# Patient Record
Sex: Female | Born: 1980 | Race: White | Hispanic: No | Marital: Married | Smoking: Current every day smoker
Health system: Southern US, Community
[De-identification: ages and names within clinical notes are randomized; demographics above are authoritative.]

## PROBLEM LIST (undated history)

## (undated) DIAGNOSIS — F32A Depression, unspecified: Secondary | ICD-10-CM

## (undated) DIAGNOSIS — F1120 Opioid dependence, uncomplicated: Secondary | ICD-10-CM

## (undated) DIAGNOSIS — F329 Major depressive disorder, single episode, unspecified: Secondary | ICD-10-CM

## (undated) HISTORY — DX: Major depressive disorder, single episode, unspecified: F32.9

## (undated) HISTORY — PX: BREAST ENHANCEMENT SURGERY: SHX7

## (undated) HISTORY — DX: Opioid dependence, uncomplicated: F11.20

## (undated) HISTORY — DX: Depression, unspecified: F32.A

## (undated) HISTORY — PX: AUGMENTATION MAMMAPLASTY: SUR837

---

## 2001-02-18 ENCOUNTER — Emergency Department (HOSPITAL_COMMUNITY): Admission: EM | Admit: 2001-02-18 | Discharge: 2001-02-18 | Payer: Self-pay | Admitting: Emergency Medicine

## 2001-03-10 ENCOUNTER — Other Ambulatory Visit: Admission: RE | Admit: 2001-03-10 | Discharge: 2001-03-10 | Payer: Self-pay | Admitting: Obstetrics and Gynecology

## 2001-04-22 ENCOUNTER — Other Ambulatory Visit: Admission: RE | Admit: 2001-04-22 | Discharge: 2001-04-22 | Payer: Self-pay | Admitting: Obstetrics & Gynecology

## 2001-05-13 ENCOUNTER — Ambulatory Visit (HOSPITAL_COMMUNITY): Admission: RE | Admit: 2001-05-13 | Discharge: 2001-05-13 | Payer: Self-pay | Admitting: Obstetrics & Gynecology

## 2001-05-13 ENCOUNTER — Encounter: Payer: Self-pay | Admitting: Obstetrics & Gynecology

## 2001-05-15 ENCOUNTER — Encounter: Payer: Self-pay | Admitting: Obstetrics & Gynecology

## 2001-05-15 ENCOUNTER — Inpatient Hospital Stay (HOSPITAL_COMMUNITY): Admission: EM | Admit: 2001-05-15 | Discharge: 2001-05-15 | Payer: Self-pay | Admitting: Emergency Medicine

## 2001-09-07 ENCOUNTER — Emergency Department (HOSPITAL_COMMUNITY): Admission: EM | Admit: 2001-09-07 | Discharge: 2001-09-07 | Payer: Self-pay | Admitting: *Deleted

## 2002-03-20 ENCOUNTER — Emergency Department (HOSPITAL_COMMUNITY): Admission: EM | Admit: 2002-03-20 | Discharge: 2002-03-20 | Payer: Self-pay | Admitting: Emergency Medicine

## 2002-05-20 ENCOUNTER — Emergency Department (HOSPITAL_COMMUNITY): Admission: EM | Admit: 2002-05-20 | Discharge: 2002-05-20 | Payer: Self-pay | Admitting: *Deleted

## 2002-09-02 ENCOUNTER — Ambulatory Visit (HOSPITAL_COMMUNITY): Admission: RE | Admit: 2002-09-02 | Discharge: 2002-09-02 | Payer: Self-pay | Admitting: Family Medicine

## 2002-09-02 ENCOUNTER — Encounter: Payer: Self-pay | Admitting: Family Medicine

## 2003-08-11 ENCOUNTER — Other Ambulatory Visit: Admission: RE | Admit: 2003-08-11 | Discharge: 2003-08-11 | Payer: Self-pay | Admitting: Obstetrics & Gynecology

## 2004-04-12 ENCOUNTER — Emergency Department (HOSPITAL_COMMUNITY): Admission: EM | Admit: 2004-04-12 | Discharge: 2004-04-12 | Payer: Self-pay | Admitting: Emergency Medicine

## 2004-04-26 ENCOUNTER — Ambulatory Visit (HOSPITAL_COMMUNITY): Admission: RE | Admit: 2004-04-26 | Discharge: 2004-04-26 | Payer: Self-pay | Admitting: Internal Medicine

## 2004-12-18 ENCOUNTER — Other Ambulatory Visit: Admission: RE | Admit: 2004-12-18 | Discharge: 2004-12-18 | Payer: Self-pay | Admitting: Obstetrics and Gynecology

## 2005-01-11 ENCOUNTER — Emergency Department (HOSPITAL_COMMUNITY): Admission: EM | Admit: 2005-01-11 | Discharge: 2005-01-11 | Payer: Self-pay | Admitting: Emergency Medicine

## 2005-04-14 ENCOUNTER — Emergency Department (HOSPITAL_COMMUNITY): Admission: EM | Admit: 2005-04-14 | Discharge: 2005-04-14 | Payer: Self-pay | Admitting: Emergency Medicine

## 2005-07-12 ENCOUNTER — Ambulatory Visit (HOSPITAL_COMMUNITY): Admission: RE | Admit: 2005-07-12 | Discharge: 2005-07-12 | Payer: Self-pay | Admitting: Obstetrics and Gynecology

## 2005-07-17 ENCOUNTER — Ambulatory Visit (HOSPITAL_COMMUNITY): Admission: AD | Admit: 2005-07-17 | Discharge: 2005-07-17 | Payer: Self-pay | Admitting: Obstetrics and Gynecology

## 2005-07-20 ENCOUNTER — Ambulatory Visit (HOSPITAL_COMMUNITY): Admission: AD | Admit: 2005-07-20 | Discharge: 2005-07-20 | Payer: Self-pay | Admitting: Obstetrics & Gynecology

## 2005-07-22 ENCOUNTER — Ambulatory Visit (HOSPITAL_COMMUNITY): Admission: AD | Admit: 2005-07-22 | Discharge: 2005-07-23 | Payer: Self-pay | Admitting: Obstetrics and Gynecology

## 2005-07-24 ENCOUNTER — Ambulatory Visit (HOSPITAL_COMMUNITY): Admission: AD | Admit: 2005-07-24 | Discharge: 2005-07-24 | Payer: Self-pay | Admitting: Obstetrics and Gynecology

## 2005-07-27 ENCOUNTER — Ambulatory Visit (HOSPITAL_COMMUNITY): Admission: AD | Admit: 2005-07-27 | Discharge: 2005-07-27 | Payer: Self-pay | Admitting: Obstetrics and Gynecology

## 2005-07-31 ENCOUNTER — Ambulatory Visit (HOSPITAL_COMMUNITY): Admission: AD | Admit: 2005-07-31 | Discharge: 2005-07-31 | Payer: Self-pay | Admitting: Obstetrics and Gynecology

## 2005-08-03 ENCOUNTER — Ambulatory Visit (HOSPITAL_COMMUNITY): Admission: AD | Admit: 2005-08-03 | Discharge: 2005-08-03 | Payer: Self-pay | Admitting: Obstetrics and Gynecology

## 2005-08-06 ENCOUNTER — Inpatient Hospital Stay (HOSPITAL_COMMUNITY): Admission: AD | Admit: 2005-08-06 | Discharge: 2005-08-09 | Payer: Self-pay | Admitting: Obstetrics and Gynecology

## 2009-11-04 ENCOUNTER — Emergency Department (HOSPITAL_COMMUNITY): Admission: EM | Admit: 2009-11-04 | Discharge: 2009-11-04 | Payer: Self-pay | Admitting: Emergency Medicine

## 2010-04-23 ENCOUNTER — Encounter: Payer: Self-pay | Admitting: Obstetrics and Gynecology

## 2010-06-16 LAB — CBC
MCH: 32.2 pg (ref 26.0–34.0)
MCHC: 34.8 g/dL (ref 30.0–36.0)
Platelets: 213 10*3/uL (ref 150–400)

## 2010-06-16 LAB — DIFFERENTIAL
Basophils Relative: 1 % (ref 0–1)
Eosinophils Absolute: 0.1 10*3/uL (ref 0.0–0.7)
Neutrophils Relative %: 63 % (ref 43–77)

## 2010-06-16 LAB — BASIC METABOLIC PANEL
CO2: 27 mEq/L (ref 19–32)
Calcium: 9.6 mg/dL (ref 8.4–10.5)
Creatinine, Ser: 0.72 mg/dL (ref 0.4–1.2)
GFR calc Af Amer: >60 mL/min (ref >60)
Glucose, Bld: 78 mg/dL (ref 70–99)

## 2010-08-18 NOTE — Discharge Summary (Signed)
Regina [name], Regina [name]             ACCOUNT NO.:  [id number]   MEDICAL RECORD NO.:  [id number]          PATIENT TYPE:  INP   LOCATION:  A412                          FACILITY:  [facility]   PHYSICIAN:  Tilda Burrow, M.D. DATE OF BIRTH:  17-Apr-1980   DATE OF ADMISSION:  08/06/2005  DATE OF DISCHARGE:  05/10/2007LH                                 DISCHARGE SUMMARY   ADMISSION DIAGNOSES:  1.  Pregnancy induced hypertension at 36.5 weeks.  2.  Prior cesarean section now for trial of labor.   DISCHARGE DIAGNOSES:  1.  Pregnancy induced hypertension at 36.5 weeks delivered.  2.  Prior cesarean section now for trial of labor.   PROCEDURE:  Repeat low transverse cervical cesarean section, Regina [name],  Aug 07, 2005.   DISCHARGE MEDICATIONS:  1.  Tylox 2 p.o. q.4 h p.r.n. pain.  2.  Prenatal vitamins 1 p.o. daily.   FOLLOW UP:  Six days staple removal.   HISTORY OF PRESENT ILLNESS:  This 30 year old female was admitted after  developing progressive symptoms, headaches, malaise, general sense of  feeling poorly with 4+ hyperreflexia, normal liver function tests but  development of headaches, upper abdominal pain and right sided back pain.  She is admitted with bedrest with blood pressure of 158/100, urinalysis  negative, reflexes 4+.  Estimated fetal weight 6.5 pounds.   Baseline labs confirmed stability of the mother with hemoglobin 10,  hematocrit 30, platelets 263,000. Liver function tests, AST of 26, ALT 20.  The patient's blood type is confirmed as A-.   HOSPITAL COURSE:  The patient was admitted and underwent bedrest overnight,  repeat cesarean section Aug 07, 2005 delivering a healthy 6 pound 0.5 ounce  female infant with Apgar's of 9 and 9. Postpartum course was uneventful. The  patient's a very energetic individual, quite active. Blood pressures were  140's/80's to 150/103 on the first postoperative day. They improved to 120's  to 140/74 to 87 on the second postoperative day.  The she was  discharged on Aug 09, 2005 at 5 p.m. in stable condition with good pain  control, healthy appearing incision with routine postsurgical instructions  reviewed including no lifting x2 weeks, no driving x2 weeks. Incision check  6 days in our office, etc.      Tilda Burrow, M.D.  Electronically Signed     JVF/MEDQ  D:  08/09/2005  T:  08/10/2005  Job:  [id number]

## 2010-08-18 NOTE — H&P (Signed)
[facility]  Patient:    Regina [name], Regina [name] Visit Number: [id number] MRN: [id number]          Service Type: Attending:  Duane Lope, M.D. Dictated by:   Duane Lope, M.D.                           History and Physical  DATE OF BIRTH:                1980/08/03  HISTORY OF PRESENT ILLNESS:   Patient is a 30 year old white female gravida 0 para 0 whose last menstrual period was April 15, 2001, who was seen in the office for evaluation of an abnormal Pap smear.  She had that performed on April 23, 2001 and she had two areas of acetowhite epithelium with no abnormal vessels and the entire squamocolumnar junction could be seen easily, and there were no endocervical lesions.  Two areas were biopsied and returned just having moderate dysplasia with involvement of endocervical glands with the moderate dysplasia, as well.  As a result, she would not be appropriate for cryotherapy in the office and is admitted for laser ablation of the cervix.  PAST MEDICAL HISTORY:         Negative.  PAST SURGICAL HISTORY:        Negative.  PAST OBSTETRICAL HISTORY:     Negative.  ALLERGIES:                    None.  MEDICATIONS:                  None.  REVIEW OF SYSTEMS:            Negative for all major organ systems.  SOCIAL HISTORY:               The patient is a Theatre stage manager currently and she gives no history of alcohol, drug abuse, or tobacco abuse.  PHYSICAL EXAMINATION:  VITAL SIGNS:                  Weight 105 pounds.  Blood pressure 110/70.  HEENT:                        Unremarkable.  Thyroid is normal.  LUNGS:                        Clear.  HEART:                        Regular rate and rhythm without murmur regurgitation or gallop.  BREASTS:                      Without mass, discharge, skin change.  No axillary adenopathy, no nipple changes.  ABDOMEN:                      Benign.  No hepatosplenomegaly, no masses, and is  nontender.  PELVIC:                       She has normal external genitalia.  Vagina clear without discharge.  Cervix without visible lesions.  Uterus normal size, shape, contour.  The ovaries are not tender.  NEUROLOGIC:  Grossly intact.  EXTREMITIES:                  Warm, no edema.  IMPRESSION:                   1. Cervical dysplasia, grade 2 or moderate.                               2. Endocervical glandular involvement.  PLAN:                         The patient is admitted for an outpatient laser ablation of the cervix secondary to dysplasia.  She understands the risks, benefits, indications, alternatives, and will proceed. Dictated by:   Duane Lope, M.D. Attending:  Duane Lope, M.D. DD:  05/09/01 TD:  05/09/01 Job: [id number]

## 2010-08-18 NOTE — H&P (Signed)
Regina [name], Regina [name]             ACCOUNT NO.:  [id number]   MEDICAL RECORD NO.:  [id number]           PATIENT TYPE:  INP   LOCATION:  A415                          FACILITY:  [facility]   PHYSICIAN:  Tilda Burrow, M.D. DATE OF BIRTH:  [birth year]   DATE OF ADMISSION:  08/06/2005  DATE OF DISCHARGE:  LH                                HISTORY & PHYSICAL   REASON FOR ADMISSION:  Pregnancy at 38 weeks, previous C-section with  pregnancy induced hypertension.   HISTORY AND PHYSICAL:  [name] is a 30 year old gravida 3, para 2, EDC is  531, previous C-section x2.  Into the office today for routine prenatal  visit.  At exam blood pressure was 140/92 and she had 2+, 3+, and 4+  reflexes.   MEDICAL HISTORY:  Positive for anxiety and HPV.   SURGICAL HISTORY:  Positive for a cold cone of her cervix and a C-section  x2.   ALLERGIES:  She is allergic to FLAGYL.   FAMILY HISTORY:  Uneventful.   PHYSICAL EXAMINATION:  VITAL SIGNS:  Weight is 171, blood pressure 140/92.  EXTREMITIES:  There is 1+ edema.  DTRs are 3+ and 4+.  ABDOMEN:  Fetal heart rate is strong and regular.   Dr. Emelda Fear was notified.   PLAN:  Admit and repeat C-section in a.m.   ADDENDUM:  Prenatal course essentially was uneventful.  Blood type is A  negative. She received her RhoGAM on 06/04/2005.  Rubella is immune.  Hepatitis B is negative.  HIV is negative.  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  Serology is  nonreactive.  Pap is class I.  GC and Chlamydia are negative.  Repeats are  negative; 28-week hemoglobin 11.2, 28-week hematocrit 3.3. One hour glucose  is 81.      Zerita Boers, Lanier Clam      Tilda Burrow, M.D.  Electronically Signed    DL/MEDQ  D:  24/40/1027  T:  08/06/2005  Job:  [id number]   cc:   Francoise Schaumann. Milford Cage DO, FAAP  Fax: [phone]

## 2010-08-18 NOTE — Op Note (Signed)
[facility]  Patient:    [name], Regina Visit Number: [id number] MRN: [id number]          Service Type: DSU Location: [facility] Attending Physician:  Lazaro Arms Proc. Date: 05/13/01 Admit Date:  05/13/2001                             Operative Report  DATE OF BIRTH:                Jul 10, 1980  PREOPERATIVE DIAGNOSIS:       Moderate dysplasia of the cervix with endocervical glandular involvement.  POSTOPERATIVE DIAGNOSIS:      Moderate dysplasia of the cervix with endocervical glandular involvement.  OPERATION:                    Laser ablation of the cervix.  SURGEON:                      ______  ASSISTANT:  ANESTHESIA:                   General endotracheal anesthesia.  Paracervical block placed.  FINDINGS:                     The patient had a colposcopic examination in my office in January due to an abnormal Pap smear.  It was felt that she had moderate to severe dysplasia on colposcopy and colposcopic directed biopsies were obtained.  She had an adequate colposcopy and no lesion in the endocervical canal was seen.  The same findings were confirmed today.  ESTIMATED BLOOD LOSS:  DESCRIPTION OF PROCEDURE:     The patient was taken to the operating room and placed in the supine position where she underwent general endotracheal anesthesia.  She was then placed in the dorsal lithotomy position.  Her urethral opening was prepped and the vagina was prepped x1.  Her bladder was drained.  A speculum was placed and the laser coloposcope was used.  Acetic acid was placed and the above noted findings were confirmed and no further findings different from the office were seen.  The laser was placed on 20 watts continuous power.  A paracervical block was placed using 0.5% Marcaine plain and 20 cc total was given.  The spot size was enlarged somewhat and a 2 to 3 mm margin was obtained around the cervical dysplasia and this was carried to a  depth of 5 to 7 mm peripherally down to 7 to 9 mm centrally in a conical fashion.  Hemostasis was achieved using the laser and another spongestick was placed and all epithelial tissue was found to have been ablated.  Monsels was then placed and laser was then used one more to seal the eschar.  The patient tolerated the procedure well.  She was awakened from anesthesia and taken to the recovery room in good stable condition.  Sponge, needle, and instrument counts were correct.  Having received normal blood loss, she is discharged to home on pelvic rest from the PACU.  Given Toradol for pain and instructed to follow up in one month and told to remain in pelvic rest until she is seen in one month. Attending Physician:  Lazaro Arms DD:  05/13/01 TD:  05/13/01 Job: [id number]

## 2010-08-18 NOTE — H&P (Signed)
Regina [name]             ACCOUNT NO.:  [id number]   MEDICAL RECORD NO.:  [id number]          PATIENT TYPE:  INP   LOCATION:  A415                          FACILITY:  [facility]   PHYSICIAN:  Tilda Burrow, M.D. DATE OF BIRTH:  1981-01-02   DATE OF ADMISSION:  08/06/2005  DATE OF DISCHARGE:  LH                                HISTORY & PHYSICAL   ADMITTING DIAGNOSIS:  Pregnancy-induced hypertension, 36.5 weeks.   HISTORY OF PRESENT ILLNESS:  This 30 year old female gravida 2, para 1,  prior cesarean section in 2000, is admitted for repeat cesarean section.  She was scheduled previously for [redacted] weeks gestation for repeat C-section on  May 21 but has unfortunately progressed in her pregnancy-induced  hypertension symptoms.  Last week we added labetalol 100 mg b.i.d. when  blood pressures were in the 90s diastolic.  Unfortunately, she presents for  her office visit on Aug 06, 2005, with progressive complaints of headache,  upper abdominal pain and right-sided pain with normal liver function tests.  She has a general sense of deteriorating overall condition and reflexes have  increased to 4+.  We are admitting her for bedrest overnight and then repeat  cesarean section on Aug 07, 2005.   PAST MEDICAL HISTORY:  Anxiety.   SURGICAL HISTORY:  1.  Prior cesarean section 2000.  2.  Laser ablation of cervical dysplasia 2003.   ALLERGIES:  None known.   PHYSICAL EXAMINATION:  VITAL SIGNS:  Height 5 feet 6 inches, weight 170.  Blood pressure 158/100 in the office, pulse 90s, respirations 20.  GENERAL:  She is a moderately-edematous Caucasian female, alert and oriented  x3.  HEENT:  Pupils equal, round, reactive.  NECK:  Normal without JVD.  CHEST:  Clear to auscultation.  CARDIAC:  Regular rate and rhythm.  ABDOMEN:  Term gravid uterus.  Estimated fetal weight 6-and-a-half to 7  pounds.  PELVIC:  Cervix not checked, previously closed at last exam.   PRENATAL LABORATORY IN CHART:   Reflexes are notable for 4+ hyperreflexia  without clonus.  Labs are notable for hemoglobin 10; hematocrit 31.8; white  count 10,900; platelets 280,000.  SGOT 26, SGPT 20.  Blood type A negative.   PLAN:  Repeat cesarean section on Aug 07, 2005.  Will not require magnesium  sulfate unless condition deteriorates.      Tilda Burrow, M.D.  Electronically Signed     JVF/MEDQ  D:  08/07/2005  T:  08/07/2005  Job:  [id number]   cc:   Regina [name]. Milford Cage DO, FAAP  Fax: [phone]

## 2010-08-18 NOTE — Group Therapy Note (Signed)
NAMELOREDA, SILVERIO NO.:  [id number]   MEDICAL RECORD NO.:  [id number]          PATIENT TYPE:  OIB   LOCATION:  A415                          FACILITY:  [facility]   PHYSICIAN:  Tilda Burrow, M.D. DATE OF BIRTH:  01/25/1981   DATE OF PROCEDURE:  DATE OF DISCHARGE:  07/24/2005                                   PROGRESS NOTE   HISTORY:  Regina [name] was sent from the office for evaluation of increased  blood pressure.  She has been dealing with this off and on for about the  past month.   Her labs today were all within normal limits, specifically no proteinuria.  Liver function tests were normal.  Uric acid was normal.   She had a reactive non-stress test.  Blood pressures at the hospital were in  the 130/80 range.  Regina [name] has already been scheduled for twice-weekly non-  stress tests on Tuesdays and Fridays.  She knows that she is to take it easy  at home.  She does monitor her blood pressure at home and knows to follow up  if it becomes elevated.   IMPRESSION:  1.  Intrauterine pregnancy at 34 weeks 5 days gestation.  2.  Pregnancy-induced hypertension without proteinuria.   PLAN:  As stated above.  We will continue with biweekly non-stress tests and  urine dips for protein.      Regina [name], C.N.M.      Tilda Burrow, M.D.  Electronically Signed    FC/MEDQ  D:  07/24/2005  T:  07/25/2005  Job:  [id number]

## 2010-08-18 NOTE — H&P (Signed)
NAMEMEE, MACDONNELL             ACCOUNT NO.:  [id number]   MEDICAL RECORD NO.:  [id number]          PATIENT TYPE:  OIB   LOCATION:  A415                          FACILITY:  [facility]   PHYSICIAN:  Tilda Burrow, M.D. DATE OF BIRTH:  [birth year]   DATE OF ADMISSION:  07/24/2005  DATE OF DISCHARGE:  LH                                HISTORY & PHYSICAL   REASON FOR ADMISSION:  Pregnancy at 34 weeks and five days with pregnancy-  induced hypertension.   PAST MEDICAL HISTORY:  1.  Positive for anxiety attacks.  2.  HPV.   PAST SURGICAL HISTORY:  Positive for a C-section x2.   ALLERGIES:  FLAGYL.   PRENATAL COURSE:  Essentially uneventful, up until this point.   LABORATORY DATA:  Blood type is A-negative.  Serology is nonreactive.  Rubella is immune.  Hepatitis-B surface antigen is negative.  HIV is  negative.  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  Pap is normal.  GC and Chlamydia are negative.  AFP normal.  A 28-week hemoglobin was 11.2, 28-week hematocrit was 34.3.  One-hour glucose 81.   PHYSICAL EXAMINATION:  VITAL SIGNS:  Weight 170 pounds, blood pressure  156/90.  ABDOMEN:  Good fetal movement noted.  Fetal heart rate 150, strong and  regular.  Fundal height is 34 cm.  EXTREMITIES:  There is a trace edema in the lower extremities.   PLAN:  Admit.  Observe.  Get an NST and consult Dr. Tilda Burrow and Dr.  __Eure____ with regards to continued plan of care.      Zerita Boers, Lanier Clam      Tilda Burrow, M.D.  Electronically Signed    DL/MEDQ  D:  11/91/4782  T:  07/24/2005  Job:  [id number]

## 2010-08-18 NOTE — Op Note (Signed)
Regina [name], Regina [name] NO.:  [id number]   MEDICAL RECORD NO.:  [id number]          PATIENT TYPE:  INP   LOCATION:  A412                          FACILITY:  [facility]   PHYSICIAN:  Tilda Burrow, M.D. DATE OF BIRTH:  17-Sep-1980   DATE OF PROCEDURE:  08/07/2005  DATE OF DISCHARGE:  08/03/2005                                 OPERATIVE REPORT   PREOPERATIVE DIAGNOSIS:  Pregnancy 36 weeks, 5 days, prior cesarean section  after trial of labor, pregnancy induced hypertension.   POSTOPERATIVE DIAGNOSIS:  Pregnancy 36 weeks, 5 days, prior cesarean section  after trial of labor, pregnancy induced hypertension.   OPERATION PERFORMED:  Repeat low transverse cervical cesarean section.   SURGEON:  Tilda Burrow, M.D.   ASSISTANT:  Richardean Sale, M.D.   ANESTHESIA:  Spinal.   COMPLICATIONS:  None.   FINDINGS:  Healthy 6 pound 0.5 ounce female infant, Apgars 9 and 9, cared  for by Francoise Schaumann. Halm, M.D.   ESTIMATED BLOOD LOSS:  400 mL.   DESCRIPTION OF PROCEDURE:  The patient was taken to the operating room,  prepped and draped after spinal anesthesia introduced.  Pfannenstiel  incision was repeated with the patient being very comfortable with analgesia  to level of T8.  The old cicatrix was excised, the fascial layer found to be  extremely fibrous from her two prior cesarean sections.  We were able to  with some difficulty dissect the rectus muscles off the overlying fascia.  The peritoneal cavity was entered in the midline.  Bladder flap was  developed on the lower uterine segment which was extremely thin, and the  amniotic fluid membranes opened, found to be clear without malodor.  Fetal  vertex was delivered using manual guidance and fundal pressure.  Infant  delivered, bulb suction was performed in the nasopharynx and the baby cried  spontaneously.  The rest of the fetal body had been delivered by this time  out of the incision, cord was clamped and the infant  passed to the care of  Dr. Milford Cage.  The cord blood samples were obtained.  Placenta delivered intact  Tomasa Blase presentation, three-vessel cord confirmed with very low blood loss.  Uterine tone was excellent.  A single layer of running locking closure of  the uterine incision was performed after antibiotic irrigation of the  uterine cavity and incision edges.  The bladder flap did not require  reapproximation as the bladder flap edges were quite close together  naturally.   The abdomen was irrigated with antibiotic solution.  Anterior peritoneum  closed with running 2-0 chromic.  The rectus muscles were pulled close  together using interrupted 2-0 plain x3.  Pyramidalis muscles as well.   The fascia was closed in a continuous running 0 Vicryl fashion.  The  internal oblique muscles were quite developed on either side and were pulled  together with a separate suture before the fascial closure was performed.  Subcutaneous tissues were trimmed to get back to smooth fatty tissue without  significant fibrosis, hemostasis achieved with Bovie cautery as necessary,  then the subcutaneous fatty tissue pulled  together with five interrupted  sutures of 2-0 plain followed by staple closure of the skin.  The patient  tolerated the procedure well and went to recovery room in good condition.  The estimated blood loss was 400 mL.      Tilda Burrow, M.D.  Electronically Signed     JVF/MEDQ  D:  08/07/2005  T:  08/08/2005  Job:  [id number]   cc:   Francoise Schaumann. Milford Cage DO, FAAP  Fax: [phone]

## 2010-08-18 NOTE — Discharge Summary (Signed)
[facility]  Patient:    Regina [name], Regina [name] Visit Number: [id number] MRN: [id number]          Service Type: OBS Location: 4A A427 01 Attending Physician:  Lazaro Arms Dictated by:   Duane Lope, M.D. Admit Date:  05/13/2001 Discharge Date: 05/15/2001                             Discharge Summary  DISCHARGE DIAGNOSES: 1. Probable viral syndrome. 2. Bronchitis. 3. Status post her laser ablation of the cervix on May 13, 2001.  PROCEDURES: 1. Admission to hospital. 2. IV antibiotics.  HISTORY:  Please refer to the history and physical details for admission to hospital.  HOSPITAL COURSE:  The patient was admitted with a concern regarding an aspiration, pneumonia. She had had general anesthesia earlier in the day and had an unremarkable course at the [facility] down in [place], but she had called up complaining of a temperature of 103 and a new onset cough and some chest discomfort. As a result in the emergency room she had a temperature of 102.2. A chest x-ray was found to be clear and her exam was clear, but nonetheless with that history and her new symptoms and fever I thought it best to admit her to the hospital for some IV antibiotics to cover for possible aspiration. She continued to be febrile throughout, her white count dropping actually from 5.4 to 3.2, now down to 2.9 with neutrophils diminishing and lymphocytes increasing, and also a significant number of monocytes. A monospot test was negative. The rest of her laboratory data is normal. A chest x-ray repeated this morning reveals some changes consistent with bronchitis but no aspiration. As a result with a diminishing white count, increasing shift to lymphocytes even though her fever continues to some degree, her physical exam is completely normal as far as her lungs go. She does have some congestion. I am going to discharge her home and treat her with some Zithromax p.o.  Z-Pack, and some Entex PSE, and Afrin nasal spray. I am going to see her back in the office next Monday which would be May 20, 2001 for followup, and she will call back should she have any other problems. Dictated by:   Duane Lope, M.D. Attending Physician:  Lazaro Arms DD:  05/15/01 TD:  05/15/01 Job: [id number]

## 2010-08-18 NOTE — H&P (Signed)
Regina [name], Regina [name] NO.:  [id number]   MEDICAL RECORD NO.:  [id number]          PATIENT TYPE:  OIB   LOCATION:  LDR2                          FACILITY:  [facility]   PHYSICIAN:  Tilda Burrow, M.D. DATE OF BIRTH:  Aug 28, 1980   DATE OF ADMISSION:  07/17/2005  DATE OF DISCHARGE:  LH                                HISTORY & PHYSICAL   REASON FOR ADMISSION:  Pregnancy at 33 weeks and 3 days with pregnancy-  induced hypertension, edema, and general malaise and decreased fetal  movement.   PAST MEDICAL HISTORY:  1.  Anxiety attacks.  2.  HPV.   PAST SURGICAL HISTORY:  C-section x2.   ALLERGIES:  FLAGYL.   PRENATAL COURSE:  Essentially uneventful.  She had some narcotic use in  pregnancy, but the patient was counseled in regards to that.  Blood type is  A negative, rubella immune, hepatitis B surface antigen negative, HIV  nonreactive, HPV positive, serology nonreactive, Pap normal,  GC and  Chlamydia normal, AFP normal, 28-week hemoglobin 11.2, 28-week hematocrit  34.3, one-hour glucose 81.   PHYSICAL EXAMINATION:  VITAL SIGNS:  Today, weight is 169, blood pressure  146/90.  EXTREMITIES:  She has 1+ edema and DTRs are 2+.  ABDOMEN:  Fetal heart rate 150, strong and regular.  Fundal height is 32 cm.   PLAN:  We are going to admit to observation.  Get NST, CBC, CMV, uric acid.  Monitor her vital signs and probable discharge this afternoon if her blood  pressures settle down or tomorrow.      Zerita Boers, Lanier Clam      Tilda Burrow, M.D.  Electronically Signed    DL/MEDQ  D:  16/01/9603  T:  07/17/2005  Job:  [id number]

## 2010-08-18 NOTE — Group Therapy Note (Signed)
NAMEFOY, MUNGIA NO.:  [id number]   MEDICAL RECORD NO.:  [id number]          PATIENT TYPE:  OIB   LOCATION:  LDR2                          FACILITY:  [facility]   PHYSICIAN:  Lazaro Arms, M.D.   DATE OF BIRTH:  21-May-1980   DATE OF PROCEDURE:  DATE OF DISCHARGE:  07/17/2005                                   PROGRESS NOTE   Please see admitting dictation by Zerita Boers, N.M., for the history.   All of Regina [name]'s labs were normal, specifically platelets 293, H&H 10.8 and  30.6.  Liver function tests, AST and ALT were both 14.  Urine was negative  for protein.  She did have an unexplained potassium of 2.8.  Nonstress test  was reactive.  All blood pressures are in the 120/90 range.  After rest, the  diastolic comes down to the 80 range.  Regina [name] has a strong desire not to  be observed overnight.  She has agreed to monitor her blood pressure at home  with a machine that she has there and to return to the office for a visit  tomorrow.  We are also going to be doing bi-weekly nonstress tests on her  for hypertension.  She has been running sometimes as high as 150s/100s at  home, but it usually comes down after rest.   IMPRESSION:  1.  Intrauterine pregnancy at 33-1/2 weeks.  2.  Pregnancy-induced hypertension without proteinuria at this time.  3.  Hypokalemia.   PLAN:  Bi-weekly nonstress tests, frequent office visits to be scheduled  tomorrow.      Jacklyn Shell, C.N.M.      Lazaro Arms, M.D.  Electronically Signed    FC/MEDQ  D:  07/17/2005  T:  07/18/2005  Job:  [id number]

## 2013-11-28 ENCOUNTER — Encounter (HOSPITAL_COMMUNITY): Payer: Self-pay | Admitting: Emergency Medicine

## 2013-11-28 ENCOUNTER — Emergency Department (HOSPITAL_COMMUNITY)
Admission: EM | Admit: 2013-11-28 | Discharge: 2013-11-28 | Disposition: A | Discharge status: Home / Self Care | Payer: BC Managed Care – PPO | Attending: Emergency Medicine | Admitting: Emergency Medicine

## 2013-11-28 DIAGNOSIS — F17200 Nicotine dependence, unspecified, uncomplicated: Secondary | ICD-10-CM | POA: Insufficient documentation

## 2013-11-28 DIAGNOSIS — J029 Acute pharyngitis, unspecified: Secondary | ICD-10-CM | POA: Insufficient documentation

## 2013-11-28 DIAGNOSIS — R000 Tachycardia, unspecified: Secondary | ICD-10-CM | POA: Insufficient documentation

## 2013-11-28 DIAGNOSIS — J028 Acute pharyngitis due to other specified organisms: Secondary | ICD-10-CM

## 2013-11-28 DIAGNOSIS — B9789 Other viral agents as the cause of diseases classified elsewhere: Secondary | ICD-10-CM

## 2013-11-28 LAB — RAPID STREP SCREEN (MED CTR MEBANE ONLY): Streptococcus, Group A Screen (Direct): NEGATIVE

## 2013-11-28 MED ORDER — LIDOCAINE VISCOUS 2 % MT SOLN: 20.0000 mL | OROMUCOSAL | Status: DC | PRN

## 2013-11-28 NOTE — ED Notes (Signed)
Pt reports sore throat and intermittent fever x3 days. Airway patent. Voice hoarse. nad noted.

## 2013-11-28 NOTE — ED Provider Notes (Signed)
CSN: [id number]     Arrival date & time 11/28/13  1653 History   First MD Initiated Contact with Patient 11/28/13 1742     Chief Complaint  Patient presents with  . Sore Throat     (Consider location/radiation/quality/duration/timing/severity/associated sxs/prior Treatment) Patient is a 33 y.o. female presenting with pharyngitis. The history is provided by the patient.  Sore Throat This is a new problem. The current episode started in the past 7 days. The problem occurs constantly. The problem has been gradually worsening. The symptoms are aggravated by coughing. She has tried NSAIDs for the symptoms. The treatment provided mild relief.   Regina [name] is a 33 y.o. female who presents to the ED with a sore throat that started 3 days ago. She has a cough but is a smoker and contributes the cough to smoking. She has had chills but not sure of fever. She has had some swollen glands on the right side of her neck. She has been taking ibuprofen with some relief. She denies any other problems today.   History reviewed. No pertinent past medical history. Past Surgical History  Procedure Laterality Date  . Cesarean section     History reviewed. No pertinent family history. History  Substance Use Topics  . Smoking status: Current Every Day Smoker -- 0.50 packs/day  . Smokeless tobacco: Not on file  . Alcohol Use: No   OB History   Grav Para Term Preterm Abortions TAB SAB Ect Mult Living                 Review of Systems Negative except as stated in HPI.    Allergies  Erythromycin  Home Medications   Prior to Admission medications   Not on File   BP 125/79  Pulse 118  Temp(Src) 99.3 F (37.4 C) (Oral)  Ht  (1.575 m)  Wt 120 lb (54.432 kg)  BMI 21.94 kg/m2  SpO2 100% Physical Exam  Nursing note and vitals reviewed. Constitutional: She is oriented to person, place, and time. She appears well-developed and well-nourished. No distress.  HENT:  Head: Normocephalic.   Right Ear: Tympanic membrane normal.  Left Ear: Tympanic membrane normal.  Nose: Nose normal.  Mouth/Throat: Uvula is midline and mucous membranes are normal. Posterior oropharyngeal erythema: mild.  Eyes: Conjunctivae and EOM are normal.  Neck: Normal range of motion. Neck supple.  Cardiovascular: Tachycardia present.   Pulmonary/Chest: Effort normal. No respiratory distress. She has no wheezes. She has no rales.  Abdominal: Soft. There is no tenderness.  Musculoskeletal: Normal range of motion.  Lymphadenopathy:    She has cervical adenopathy (right).  Neurological: She is alert and oriented to person, place, and time. No cranial nerve deficit.  Skin: Skin is warm and dry.  Psychiatric: She has a normal mood and affect. Her behavior is normal.    ED Course  Procedures (including critical care time) Labs Review Results for orders placed during the hospital encounter of 11/28/13 (from the past 24 hour(s))  RAPID STREP SCREEN     Status: None   Collection Time    11/28/13  4:59 PM      Result Value Ref Range   Streptococcus, Group A Screen (Direct) NEGATIVE  NEGATIVE     MDM  33 y.o. female with URI. Will treat symptoms. She will follow up with her PCP or return here as needed. Discussed with the patient clinical and lab findings and plan of care. All questioned fully answered.  Medication List         lidocaine 2 % solution  Commonly known as:  XYLOCAINE  Use as directed 20 mLs in the mouth or throat as needed for mouth pain.           Janne Napoleon, [place] 11/29/13 [phone]

## 2013-11-28 NOTE — ED Notes (Signed)
Patient with no complaints at this time. Respirations even and unlabored. Skin warm/dry. Discharge instructions reviewed with patient at this time. Patient given opportunity to voice concerns/ask questions. Patient discharged at this time and left Emergency Department with steady gait.   

## 2013-11-28 NOTE — Discharge Instructions (Signed)
Your rapid strep screen is negative. We have sent it for culture. We will call you if the culture is positive. Continue to take ibuprofen as needed for pain. Use the medication we give you for pain.

## 2013-11-29 NOTE — ED Provider Notes (Signed)
Medical screening examination/treatment/procedure(s) were performed by non-physician practitioner and as supervising physician I was immediately available for consultation/collaboration.   EKG Interpretation None        Benny Lennert, MD 11/29/13 1739

## 2013-12-01 LAB — CULTURE, GROUP A STREP

## 2014-10-23 ENCOUNTER — Emergency Department (HOSPITAL_COMMUNITY)
Admission: EM | Admit: 2014-10-23 | Discharge: 2014-10-23 | Disposition: A | Discharge status: Home / Self Care | Payer: Medicaid Other | Attending: Emergency Medicine | Admitting: Emergency Medicine

## 2014-10-23 ENCOUNTER — Encounter (HOSPITAL_COMMUNITY): Payer: Self-pay | Admitting: *Deleted

## 2014-10-23 DIAGNOSIS — S40861A Insect bite (nonvenomous) of right upper arm, initial encounter: Secondary | ICD-10-CM | POA: Insufficient documentation

## 2014-10-23 DIAGNOSIS — Z79899 Other long term (current) drug therapy: Secondary | ICD-10-CM | POA: Diagnosis not present

## 2014-10-23 DIAGNOSIS — Z720 Tobacco use: Secondary | ICD-10-CM | POA: Diagnosis not present

## 2014-10-23 DIAGNOSIS — S80869A Insect bite (nonvenomous), unspecified lower leg, initial encounter: Secondary | ICD-10-CM | POA: Diagnosis present

## 2014-10-23 DIAGNOSIS — Y9289 Other specified places as the place of occurrence of the external cause: Secondary | ICD-10-CM | POA: Diagnosis not present

## 2014-10-23 DIAGNOSIS — Y9389 Activity, other specified: Secondary | ICD-10-CM | POA: Insufficient documentation

## 2014-10-23 DIAGNOSIS — S80861A Insect bite (nonvenomous), right lower leg, initial encounter: Secondary | ICD-10-CM | POA: Diagnosis not present

## 2014-10-23 DIAGNOSIS — Y998 Other external cause status: Secondary | ICD-10-CM | POA: Insufficient documentation

## 2014-10-23 DIAGNOSIS — W57XXXA Bitten or stung by nonvenomous insect and other nonvenomous arthropods, initial encounter: Secondary | ICD-10-CM | POA: Diagnosis not present

## 2014-10-23 MED ORDER — SULFAMETHOXAZOLE-TRIMETHOPRIM 800-160 MG PO TABS: 1.0000 | ORAL_TABLET | Freq: Two times a day (BID) | ORAL | Status: DC

## 2014-10-23 MED ORDER — MUPIROCIN CALCIUM 2 % EX CREA: 1.0000 "application " | TOPICAL_CREAM | Freq: Two times a day (BID) | CUTANEOUS | Status: DC

## 2014-10-23 NOTE — ED Notes (Signed)
Last weekend bitten by spider on LLE.  States since then, site has not improved and she is now having swelling in bilateral LE and bilateral hands.  States since incident, if she gets a small cut, it has a hyperresponsive reaction. Denies fever, chill, n/v.

## 2014-10-23 NOTE — Discharge Instructions (Signed)

## 2014-10-23 NOTE — ED Provider Notes (Signed)
CSN: [id number]     Arrival date & time 10/23/14  1610 History   First MD Initiated Contact with Patient 10/23/14 1620     Chief Complaint  Patient presents with  . Insect Bite     (Consider location/radiation/quality/duration/timing/severity/associated sxs/prior Treatment) HPI   Regina [name] is a 34 y.o. female who presents for evaluation of multiple sores. Which she feels like starter by a bug bite, on her left lower leg several days ago. His additional sores on the right leg, right arm and on her face. She denies fever, chills, nausea, vomiting, weakness or dizziness. States that she has intermittent lower leg edema that is "3+". No prior similar problems in the past. There are no other known modifying factors.   History reviewed. No pertinent past medical history. Past Surgical History  Procedure Laterality Date  . Cesarean section     History reviewed. No pertinent family history. History  Substance Use Topics  . Smoking status: Current Every Day Smoker -- 0.50 packs/day  . Smokeless tobacco: Not on file  . Alcohol Use: No   OB History    No data available     Review of Systems  All other systems reviewed and are negative.     Allergies  Erythromycin  Home Medications   Prior to Admission medications   Medication Sig Start Date End Date Taking? Authorizing Provider  amphetamine-dextroamphetamine (ADDERALL) 30 MG tablet Take 1 tablet by mouth 2 (two) times daily. 10/08/14  Yes Historical Provider, MD  sertraline (ZOLOFT) 100 MG tablet Take 100 mg by mouth daily. 10/03/14  Yes Historical Provider, MD  SUBOXONE 8-2 MG FILM Place 1 Film under the tongue 3 (three) times daily. 09/11/14  Yes Historical Provider, MD  lidocaine (XYLOCAINE) 2 % solution Use as directed 20 mLs in the mouth or throat as needed for mouth pain. Patient not taking: Reported on 10/23/2014 11/28/13   Janne Napoleon, NP  mupirocin cream (BACTROBAN) 2 % Apply 1 application topically 2 (two) times  daily. 10/23/14   Mancel Bale, MD  sulfamethoxazole-trimethoprim (BACTRIM DS,SEPTRA DS) 800-160 MG per tablet Take 1 tablet by mouth 2 (two) times daily. 10/23/14   Mancel Bale, MD   BP 127/89 mmHg  Pulse 113  Temp(Src) 99 F (37.2 C) (Oral)  Resp 18  Ht  (1.575 m)  Wt 130 lb (58.968 kg)  BMI 23.77 kg/m2  SpO2 100% Physical Exam  Constitutional: She is oriented to person, place, and time. She appears well-developed and well-nourished.  HENT:  Head: Normocephalic and atraumatic.  Right Ear: External ear normal.  Left Ear: External ear normal.  Eyes: Conjunctivae and EOM are normal. Pupils are equal, round, and reactive to light.  Neck: Normal range of motion and phonation normal. Neck supple.  Cardiovascular: Normal rate.   Pulmonary/Chest: Effort normal. She exhibits no bony tenderness.  Abdominal: Soft. There is no tenderness.  Musculoskeletal: Normal range of motion.  Neurological: She is alert and oriented to person, place, and time. No cranial nerve deficit or sensory deficit. She exhibits normal muscle tone. Coordination normal.  Skin: Skin is warm, dry and intact.  Few scattered red raised papules with central defects consistent with bug bites, of the right arm and both legs. Small red lesion at the right ankle of the lips, which is nonspecific in nature. There are no areas of drainage or fluctuance.  Psychiatric: She has a normal mood and affect. Her behavior is normal. Judgment and thought content normal.  Nursing  note and vitals reviewed.   ED Course  Procedures (including critical care time)  Findings discussed with atient, all questions were answered.  Labs Review Labs Reviewed - No data to display  Imaging Review No results found.   EKG Interpretation None      MDM   Final diagnoses:  Insect bite    Scattered Insect bites with somewhat exaggerated response, raising concern for MRSA infection. No apparent systemic illness.  Nursing Notes  Reviewed/ Care Coordinated Applicable Imaging Reviewed Interpretation of Laboratory Data incorporated into ED treatment  The patient appears reasonably screened and/or stabilized for discharge and I doubt any other medical condition or other [facility] requiring further screening, evaluation, or treatment in the ED at this time prior to discharge.  Plan: Home Medications- Septra and Bactroban; Home Treatments- warm compresses; return here if the recommended treatment, does not improve the symptoms; Recommended follow up- PCP follow-up in one week.     Mancel Bale, MD 10/23/14 [phone]

## 2015-03-08 ENCOUNTER — Ambulatory Visit (INDEPENDENT_AMBULATORY_CARE_PROVIDER_SITE_OTHER): Payer: BLUE CROSS/BLUE SHIELD | Admitting: Women's Health

## 2015-03-08 ENCOUNTER — Encounter: Payer: Self-pay | Admitting: Women's Health

## 2015-03-08 ENCOUNTER — Other Ambulatory Visit (HOSPITAL_COMMUNITY)
Admission: RE | Admit: 2015-03-08 | Discharge: 2015-03-08 | Disposition: A | Discharge status: Home / Self Care | Payer: Medicaid Other | Source: Ambulatory Visit | Attending: Obstetrics & Gynecology | Admitting: Obstetrics & Gynecology

## 2015-03-08 VITALS — BP 104/74 | HR 84 | Ht 61.75 in | Wt 127.0 lb

## 2015-03-08 DIAGNOSIS — Z01419 Encounter for gynecological examination (general) (routine) without abnormal findings: Secondary | ICD-10-CM | POA: Insufficient documentation

## 2015-03-08 DIAGNOSIS — F1111 Opioid abuse, in remission: Secondary | ICD-10-CM

## 2015-03-08 DIAGNOSIS — N6310 Unspecified lump in the right breast, unspecified quadrant: Secondary | ICD-10-CM | POA: Insufficient documentation

## 2015-03-08 DIAGNOSIS — Z1151 Encounter for screening for human papillomavirus (HPV): Secondary | ICD-10-CM | POA: Insufficient documentation

## 2015-03-08 DIAGNOSIS — F17200 Nicotine dependence, unspecified, uncomplicated: Secondary | ICD-10-CM
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
## 2015-03-08 NOTE — Patient Instructions (Signed)
[facility], be there at 3:00pm for a 3:20pm appointment No deoderant, lotion, powder

## 2015-03-09 DIAGNOSIS — F17200 Nicotine dependence, unspecified, uncomplicated: Secondary | ICD-10-CM | POA: Insufficient documentation

## 2015-03-09 DIAGNOSIS — F1111 Opioid abuse, in remission: Secondary | ICD-10-CM | POA: Insufficient documentation

## 2015-03-10 LAB — CYTOLOGY - PAP

## 2015-03-15 ENCOUNTER — Ambulatory Visit (HOSPITAL_COMMUNITY)
Admission: RE | Admit: 2015-03-15 | Discharge: 2015-03-15 | Disposition: A | Discharge status: Home / Self Care | Payer: BLUE CROSS/BLUE SHIELD | Source: Ambulatory Visit | Attending: Women's Health | Admitting: Women's Health

## 2015-03-15 ENCOUNTER — Other Ambulatory Visit: Payer: Self-pay | Admitting: Women's Health

## 2015-03-15 ENCOUNTER — Other Ambulatory Visit (HOSPITAL_COMMUNITY): Payer: Self-pay | Admitting: Physician Assistant

## 2015-03-15 DIAGNOSIS — R928 Other abnormal and inconclusive findings on diagnostic imaging of breast: Secondary | ICD-10-CM

## 2015-03-15 DIAGNOSIS — N63 Unspecified lump in breast: Secondary | ICD-10-CM | POA: Diagnosis present

## 2015-03-15 DIAGNOSIS — N6310 Unspecified lump in the right breast, unspecified quadrant: Secondary | ICD-10-CM

## 2015-03-17 ENCOUNTER — Ambulatory Visit: Payer: BLUE CROSS/BLUE SHIELD | Admitting: Adult Health

## 2015-03-22 ENCOUNTER — Ambulatory Visit (HOSPITAL_COMMUNITY): Admission: RE | Admit: 2015-03-22 | Payer: BLUE CROSS/BLUE SHIELD | Source: Ambulatory Visit

## 2015-04-06 ENCOUNTER — Ambulatory Visit: Payer: BLUE CROSS/BLUE SHIELD | Admitting: Women's Health

## 2015-04-12 ENCOUNTER — Ambulatory Visit: Payer: BLUE CROSS/BLUE SHIELD | Admitting: Women's Health

## 2017-08-01 ENCOUNTER — Encounter: Payer: Self-pay | Admitting: Women's Health

## 2017-08-01 ENCOUNTER — Other Ambulatory Visit: Payer: Self-pay

## 2017-08-01 ENCOUNTER — Ambulatory Visit (INDEPENDENT_AMBULATORY_CARE_PROVIDER_SITE_OTHER): Payer: BLUE CROSS/BLUE SHIELD | Admitting: Women's Health

## 2017-08-01 VITALS — BP 106/64 | HR 104 | Ht 61.0 in | Wt 127.0 lb

## 2017-08-01 DIAGNOSIS — Z30432 Encounter for removal of intrauterine contraceptive device: Secondary | ICD-10-CM | POA: Diagnosis not present

## 2017-08-01 DIAGNOSIS — Z30011 Encounter for initial prescription of contraceptive pills: Secondary | ICD-10-CM

## 2017-08-01 MED ORDER — DOXYCYCLINE HYCLATE 100 MG PO TABS: 100.0000 mg | ORAL_TABLET | Freq: Two times a day (BID) | ORAL | 0 refills | Status: AC

## 2017-08-01 MED ORDER — NORETHINDRONE 0.35 MG PO TABS: 1.0000 | ORAL_TABLET | Freq: Every day | ORAL | 11 refills | Status: AC

## 2017-08-01 NOTE — Progress Notes (Signed)
   IUD REMOVAL  Patient name: Regina [name] MRN [id number]  Date of birth: 11-Apr-1980 Subjective Findings:   NAKIAH [name] is a 37 y.o. G3P3 Caucasian female being seen today for removal of a Mirena IUD. Her IUD was placed 2007.   Signed copy of informed consent in chart.   No LMP recorded. (Menstrual status: IUD). Last pap12/6/16. Results were:  neg w/ -HRHPV The planned method of family planning is oral progesterone-only contraceptive (>35yo, smokes) Pertinent History Reviewed:   Reviewed past medical,surgical, social, obstetrical and family history.  Reviewed problem list, medications and allergies. Objective Findings & Procedure:    Vitals:   08/01/17 1131  BP: 106/64  Pulse: (!) 104  Weight: 127 lb (57.6 kg)  Height:  (1.549 m)  Body mass index is 24 kg/m.  No results found for this or any previous visit (from the past 24 hour(s)).   Time out was performed.  A graves speculum was placed in the vagina.  The cervix was visualized, and the strings WERE NOT visible. I tried teasing it out w/ cyto brush, then tried grasping with IUD hook, curved forceps w/o success. JVF in, placed paracervical block, attempted removal w/ same above instruments w/o success. Tried under transabdominal u/s guidance and finally able to remove IUD, intact, strings were above IUD.  The patient tolerated the procedure well.  Assessment & Plan:   1) Mirena IUD removal> difficult removal, rx doxycycline  BID x 10d Follow-up prn problems  2) Contraception management> Rx micronor w/ 11RF, understands has to take at exact same time daily to be effective, if late taking use condom as back-up   No orders of the defined types were placed in this encounter.   Follow-up: Return for Dec for , Pap & physical.  Cheral Marker CNM, [facility] 08/01/2017 12:47 PM

## 2017-08-01 NOTE — Patient Instructions (Signed)
Set an alarm to remind to take at exact same time daily  Norethindrone tablets (contraception) What is this medicine? NORETHINDRONE (nor eth IN drone) is an oral contraceptive. The product contains a female hormone known as a progestin. It is used to prevent pregnancy. This medicine may be used for other purposes; ask your health care provider or pharmacist if you have questions. COMMON BRAND NAME(S): Camila, Deblitane 28-Day, Errin, Heather, [place], Jolivette, Hector, Nor-QD, Nora-BE, Norlyroc, Ortho Micronor, [employer] 28-Day What should I tell my health care provider before I take this medicine? They need to know if you have any of these conditions: -blood vessel disease or blood clots -breast, cervical, or vaginal cancer -diabetes -heart disease -kidney disease -liver disease -mental depression -migraine -seizures -stroke -vaginal bleeding -an unusual or allergic reaction to norethindrone, other medicines, foods, dyes, or preservatives -pregnant or trying to get pregnant -breast-feeding How should I use this medicine? Take this medicine by mouth with a glass of water. You may take it with or without food. Follow the directions on the prescription label. Take this medicine at the same time each day and in the order directed on the package. Do not take your medicine more often than directed. Contact your pediatrician regarding the use of this medicine in children. Special care may be needed. This medicine has been used in female children who have started having menstrual periods. A patient package insert for the product will be given with each prescription and refill. Read this sheet carefully each time. The sheet may change frequently. Overdosage: If you think you have taken too much of this medicine contact a poison control center or emergency room at once. NOTE: This medicine is only for you. Do not share this medicine with others. What if I miss a dose? Try not to miss a dose.  Every time you miss a dose or take a dose late your chance of pregnancy increases. When 1 pill is missed (even if only 3 hours late), take the missed pill as soon as possible and continue taking a pill each day at the regular time (use a back up method of birth control for the next 48 hours). If more than 1 dose is missed, use an additional birth control method for the rest of your pill pack until menses occurs. Contact your health care professional if more than 1 dose has been missed. What may interact with this medicine? Do not take this medicine with any of the following medications: -amprenavir or fosamprenavir -bosentan This medicine may also interact with the following medications: -antibiotics or medicines for infections, especially rifampin, rifabutin, rifapentine, and griseofulvin, and possibly penicillins or tetracyclines -aprepitant -barbiturate medicines, such as phenobarbital -carbamazepine -felbamate -modafinil -oxcarbazepine -phenytoin -ritonavir or other medicines for HIV infection or AIDS -St. John's wort -topiramate This list may not describe all possible interactions. Give your health care provider a list of all the medicines, herbs, non-prescription drugs, or dietary supplements you use. Also tell them if you smoke, drink alcohol, or use illegal drugs. Some items may interact with your medicine. What should I watch for while using this medicine? Visit your doctor or health care professional for regular checks on your progress. You will need a regular breast and pelvic exam and Pap smear while on this medicine. Use an additional method of birth control during the first cycle that you take these tablets. If you have any reason to think you are pregnant, stop taking this medicine right away and contact your doctor or health  care professional. If you are taking this medicine for hormone related problems, it may take several cycles of use to see improvement in your  condition. This medicine does not protect you against HIV infection (AIDS) or any other sexually transmitted diseases. What side effects may I notice from receiving this medicine? Side effects that you should report to your doctor or health care professional as soon as possible: -breast tenderness or discharge -pain in the abdomen, chest, groin or leg -severe headache -skin rash, itching, or hives -sudden shortness of breath -unusually weak or tired -vision or speech problems -yellowing of skin or eyes Side effects that usually do not require medical attention (report to your doctor or health care professional if they continue or are bothersome): -changes in sexual desire -change in menstrual flow -facial hair growth -fluid retention and swelling -headache -irritability -nausea -weight gain or loss This list may not describe all possible side effects. Call your doctor for medical advice about side effects. You may report side effects to FDA at 1-800-FDA-1088. Where should I keep my medicine? Keep out of the reach of children. Store at room temperature between 15 and 30 degrees C (59 and 86 degrees F). Throw away any unused medicine after the expiration date. NOTE: This sheet is a summary. It may not cover all possible information. If you have questions about this medicine, talk to your doctor, pharmacist, or health care provider.  2018 Elsevier/Gold Standard (2011-12-07 16:41:35)

## 2018-01-10 ENCOUNTER — Other Ambulatory Visit: Payer: Self-pay | Admitting: Family Medicine

## 2018-01-10 DIAGNOSIS — R599 Enlarged lymph nodes, unspecified: Secondary | ICD-10-CM

## 2018-01-17 ENCOUNTER — Other Ambulatory Visit: Payer: BLUE CROSS/BLUE SHIELD

## 2018-01-21 ENCOUNTER — Other Ambulatory Visit: Payer: Self-pay

## 2018-01-22 ENCOUNTER — Ambulatory Visit
Admission: RE | Admit: 2018-01-22 | Discharge: 2018-01-22 | Disposition: A | Discharge status: Home / Self Care | Payer: BLUE CROSS/BLUE SHIELD | Source: Ambulatory Visit | Attending: Family Medicine | Admitting: Family Medicine

## 2018-01-22 DIAGNOSIS — R599 Enlarged lymph nodes, unspecified: Secondary | ICD-10-CM

## 2018-07-27 ENCOUNTER — Other Ambulatory Visit: Payer: Self-pay | Admitting: Women's Health

## 2019-04-24 IMAGING — US US BREAST*R* LIMITED INC AXILLA
1 series · 1 of 1 positions shown · non-contrast
Comparison: Previous exam(s).

CLINICAL DATA: Diagnostic mammogram and ultrasound report of
03/15/2015 described an indeterminate nodule within the RIGHT breast
at the 7 o'clock axis, measuring 6 mm, for which ultrasound-guided
biopsy was recommended at that time. Biopsy was not performed.
Patient returns today for follow-up diagnostic exam.

EXAM:
DIGITAL DIAGNOSTIC BILATERAL MAMMOGRAM WITH IMPLANTS, CAD AND TOMO
ULTRASOUND RIGHT BREAST
The patient has retropectoral implants. Standard and implant
displaced views were performed.

[Series 1: us breast*right* limited inc axilla · 0.06mm/px · 1 of 1 slices shown]
[im 1/1]
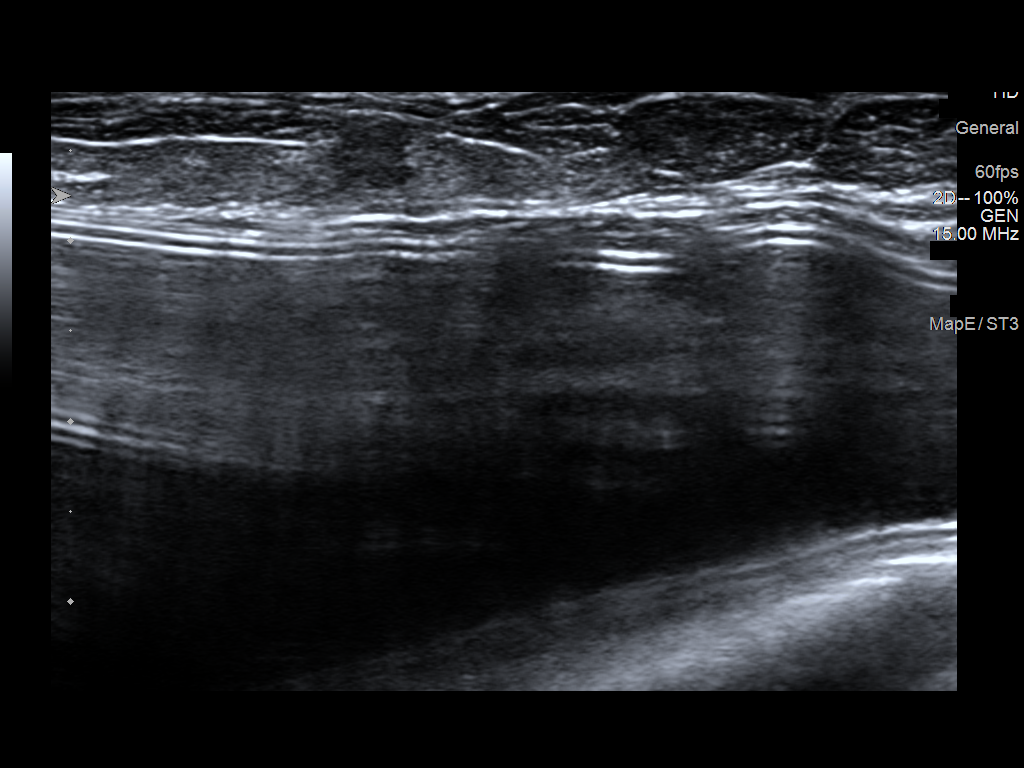

[1 of 1 positions shown; findings below may reference images not displayed]

ACR Breast Density Category c: The breast tissue is heterogeneously
dense, which may obscure small masses.
FINDINGS: There are no new dominant masses, suspicious calcifications or
secondary signs of malignancy within either breast.

Targeted ultrasound is performed, showing decreased size and
conspicuity of the hypoechoic area in the RIGHT breast, described as
a RIGHT breast nodule on previous ultrasound of 03/15/2015, the
interval decrease in size/conspicuity confirming benignity,
suspected normal fat lobule within fibroglandular tissue. No
suspicious solid or cystic mass is identified by ultrasound today.

Mammographic images were processed with CAD.
IMPRESSION: No evidence of malignancy within either breast. Patient may return
to routine annual bilateral screening mammogram schedule.

RECOMMENDATION:
Screening mammogram in one year.(Code:[id number])

I have discussed the findings and recommendations with the patient.
Results were also provided in writing at the conclusion of the
visit. If applicable, a reminder letter will be sent to the patient
regarding the next appointment.

BI-RADS CATEGORY  1: Negative.

## 2019-04-24 IMAGING — MG DIGITAL DIAGNOSTIC BILATERAL MAMMOGRAM WITH IMPLANTS, CAD AND TO
8 of 12 series · 8 of 28 positions shown · non-contrast
Comparison: Previous exam(s).

CLINICAL DATA: Diagnostic mammogram and ultrasound report of
03/15/2015 described an indeterminate nodule within the RIGHT breast
at the 7 o'clock axis, measuring 6 mm, for which ultrasound-guided
biopsy was recommended at that time. Biopsy was not performed.
Patient returns today for follow-up diagnostic exam.

EXAM:
DIGITAL DIAGNOSTIC BILATERAL MAMMOGRAM WITH IMPLANTS, CAD AND TOMO
ULTRASOUND RIGHT BREAST
The patient has retropectoral implants. Standard and implant
displaced views were performed.

[R MLO]
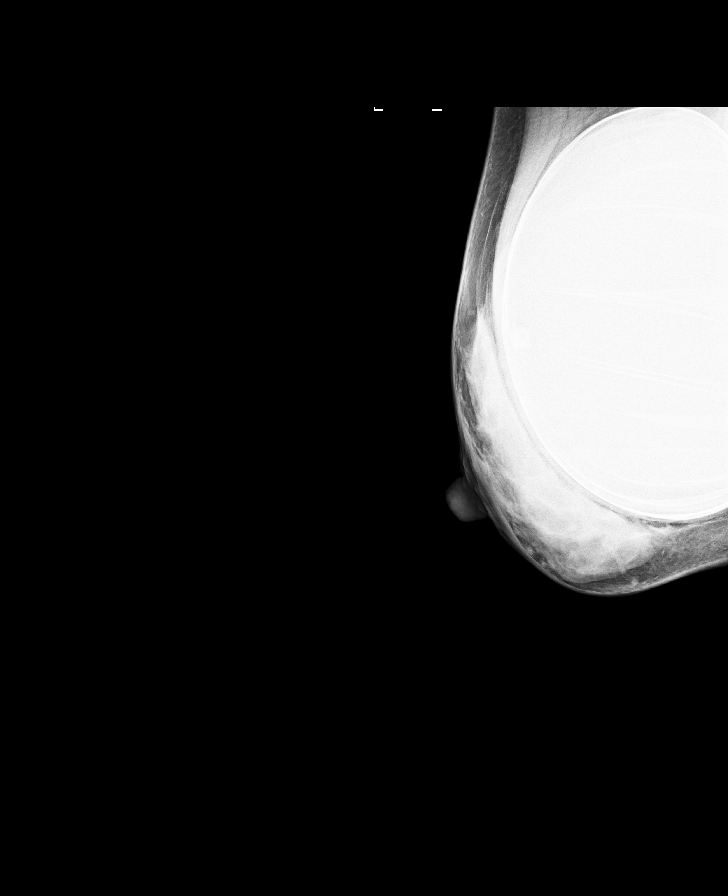

[L MLO]
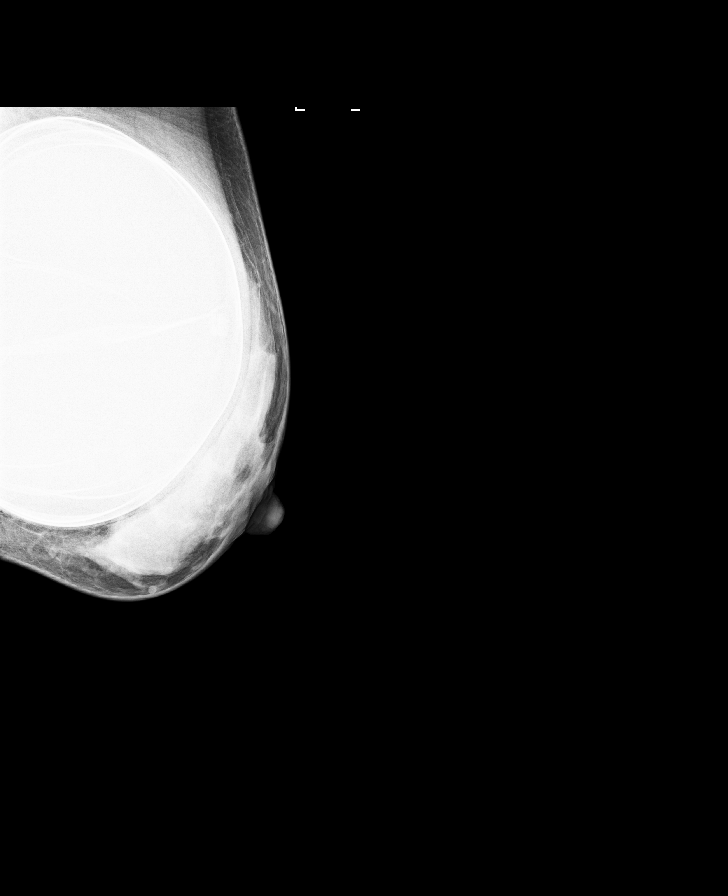

[L CC]
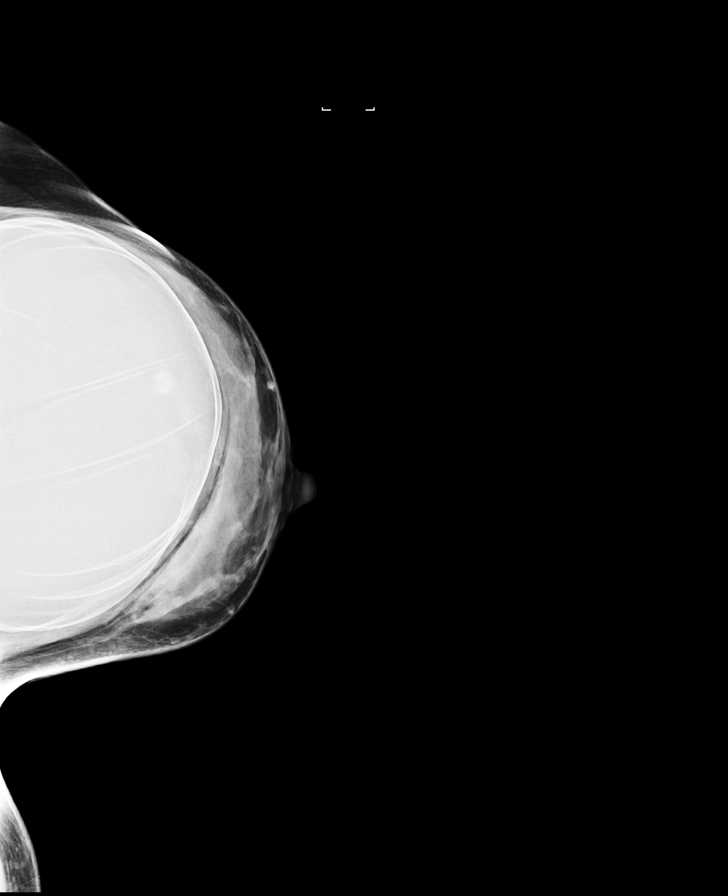

[R CC]
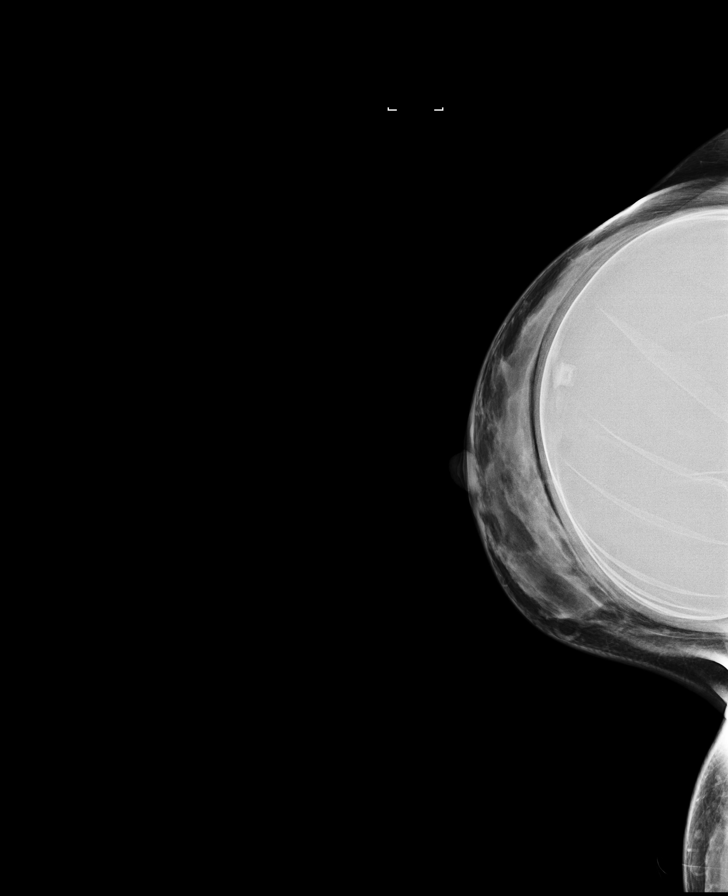

[L MLO synth-2D]
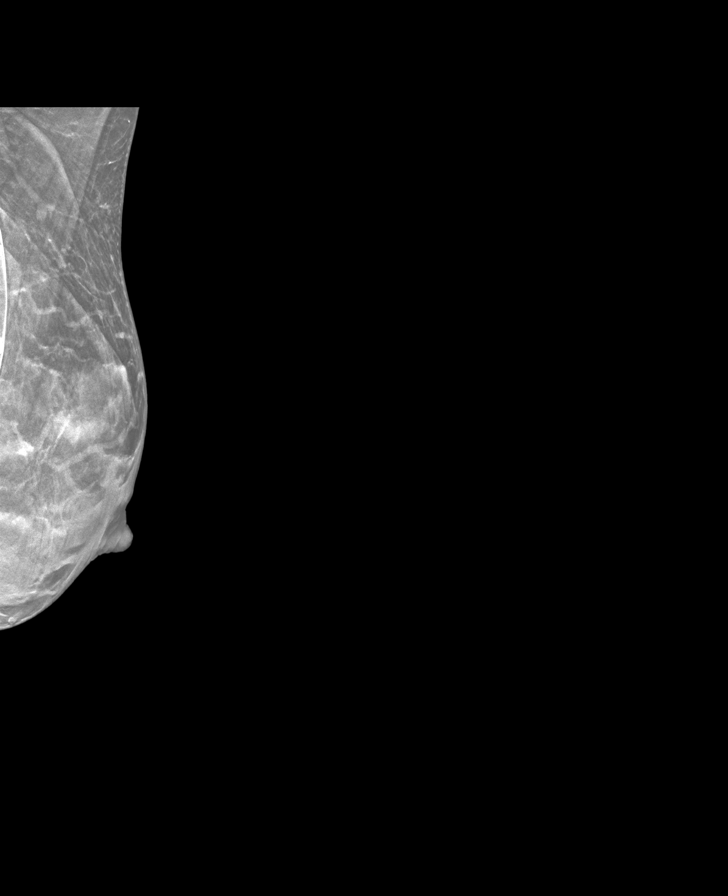

[L CC synth-2D]
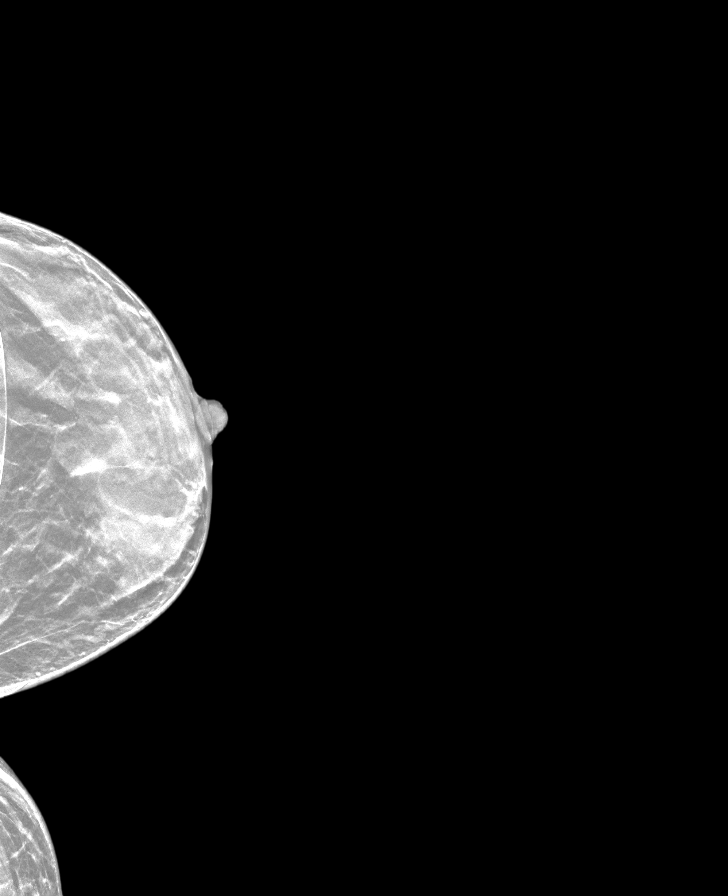

[R MLO synth-2D]
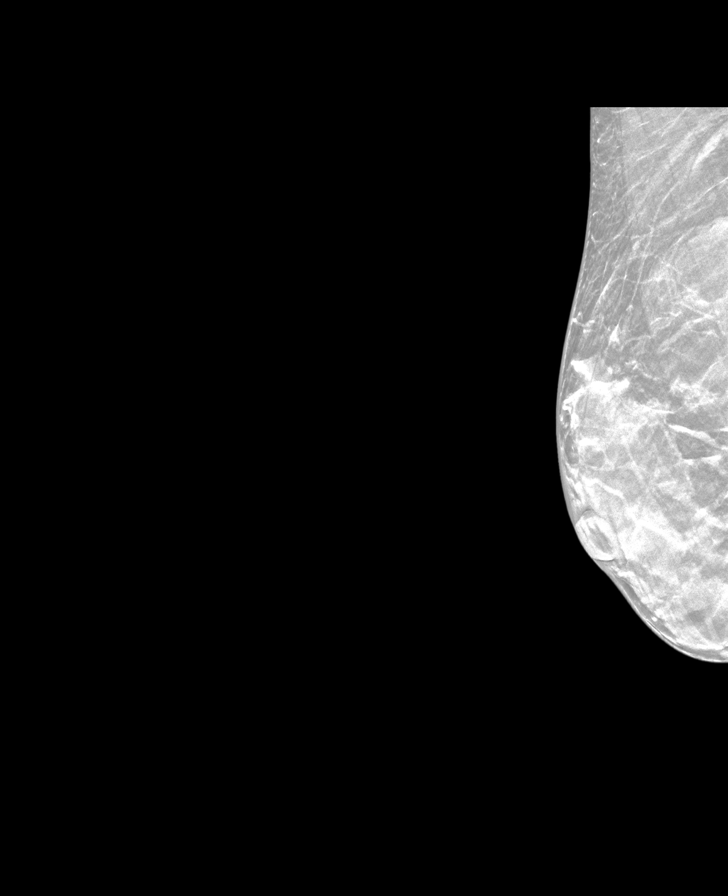

[R CC synth-2D]
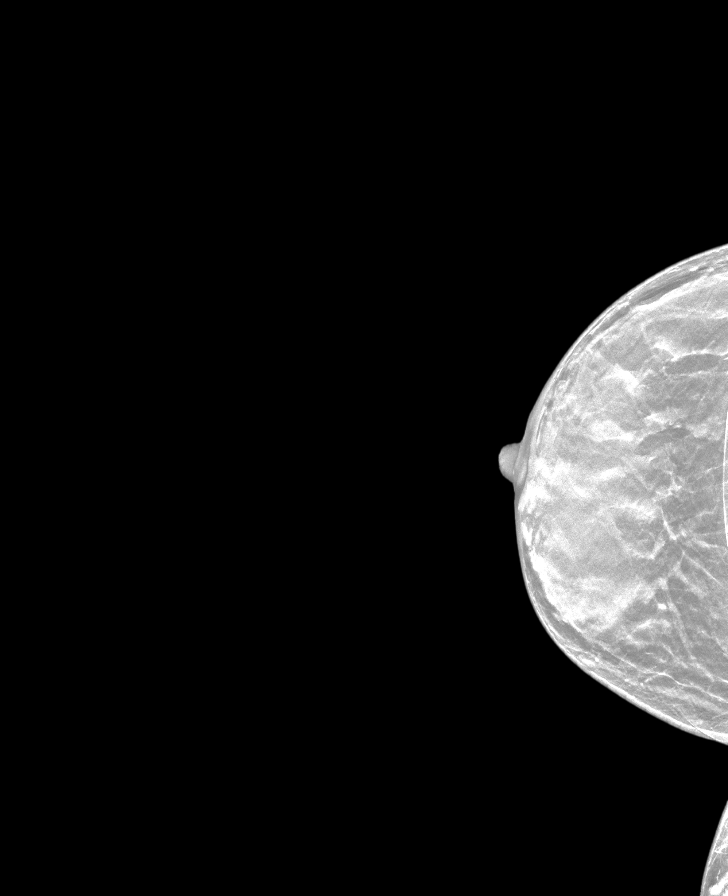

[8 of 28 positions shown; findings below may reference images not displayed]

ACR Breast Density Category c: The breast tissue is heterogeneously
dense, which may obscure small masses.
FINDINGS: There are no new dominant masses, suspicious calcifications or
secondary signs of malignancy within either breast.

Targeted ultrasound is performed, showing decreased size and
conspicuity of the hypoechoic area in the RIGHT breast, described as
a RIGHT breast nodule on previous ultrasound of 03/15/2015, the
interval decrease in size/conspicuity confirming benignity,
suspected normal fat lobule within fibroglandular tissue. No
suspicious solid or cystic mass is identified by ultrasound today.

Mammographic images were processed with CAD.
IMPRESSION: No evidence of malignancy within either breast. Patient may return
to routine annual bilateral screening mammogram schedule.

RECOMMENDATION:
Screening mammogram in one year.(Code:[id number])

I have discussed the findings and recommendations with the patient.
Results were also provided in writing at the conclusion of the
visit. If applicable, a reminder letter will be sent to the patient
regarding the next appointment.

BI-RADS CATEGORY  1: Negative.
